# Patient Record
Sex: Female | Born: 1985 | Hispanic: No | Marital: Single | Smoking: Never smoker
Health system: Southern US, Community
[De-identification: ages and names within clinical notes are randomized; demographics above are authoritative.]

## PROBLEM LIST (undated history)

## (undated) DIAGNOSIS — I10 Essential (primary) hypertension: Secondary | ICD-10-CM

---
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
## 2011-04-16 ENCOUNTER — Emergency Department (HOSPITAL_BASED_OUTPATIENT_CLINIC_OR_DEPARTMENT_OTHER)
Admission: EM | Admit: 2011-04-16 | Discharge: 2011-04-17 | Disposition: A | Discharge status: Home / Self Care | Payer: Medicaid Other | Attending: Emergency Medicine | Admitting: Emergency Medicine

## 2011-04-16 DIAGNOSIS — I10 Essential (primary) hypertension: Secondary | ICD-10-CM | POA: Insufficient documentation

## 2011-04-16 DIAGNOSIS — O2690 Pregnancy related conditions, unspecified, unspecified trimester: Secondary | ICD-10-CM | POA: Insufficient documentation

## 2011-04-16 DIAGNOSIS — R109 Unspecified abdominal pain: Secondary | ICD-10-CM | POA: Insufficient documentation

## 2011-04-16 HISTORY — DX: Essential (primary) hypertension: I10

## 2011-04-16 LAB — URINALYSIS, ROUTINE W REFLEX MICROSCOPIC
Bilirubin Urine: NEGATIVE
Glucose, UA: NEGATIVE mg/dL
Hgb urine dipstick: NEGATIVE
Ketones, ur: 40 mg/dL — AB
Leukocytes, UA: NEGATIVE
Nitrite: NEGATIVE
Protein, ur: NEGATIVE mg/dL
Specific Gravity, Urine: 1.028 (ref 1.005–1.030)
Urobilinogen, UA: 1 mg/dL (ref 0.0–1.0)
pH: 6 (ref 5.0–8.0)

## 2011-04-16 LAB — PREGNANCY, URINE: Preg Test, Ur: POSITIVE

## 2011-04-16 NOTE — ED Notes (Signed)
abd pain, vaginal d/c

## 2011-04-17 LAB — HCG, QUANTITATIVE, PREGNANCY: hCG, Beta Chain, Quant, S: 77796 m[IU]/mL — ABNORMAL HIGH (ref <5)

## 2011-04-17 MED ORDER — PRENATAL RX 60-1 MG PO TABS: 1.0000 | ORAL_TABLET | Freq: Every day | ORAL | Status: AC

## 2011-04-17 NOTE — ED Provider Notes (Signed)
History     CSN: [id number] Arrival date & time: 04/16/2011 11:59 PM   First MD Initiated Contact with Patient 04/17/11 0016      Chief Complaint  Patient presents with  . Abdominal Pain  . Vaginal Discharge    (Consider location/radiation/quality/duration/timing/severity/associated sxs/prior treatment) Patient is a 25 y.o. female presenting with cramps. The history is provided by the patient.  Abdominal Cramping The primary symptoms of the illness include abdominal pain. The primary symptoms of the illness do not include fever, shortness of breath or dysuria. The current episode started 2 days ago. The onset of the illness was gradual. The problem has not changed since onset. Associated with: LMP July 2012. The patient states that she believes she is currently pregnant. The patient has not had a change in bowel habit. Symptoms associated with the illness do not include chills, anorexia, diaphoresis, heartburn, constipation, urgency, hematuria, frequency or back pain. Associated medical issues comments: Has had some increased white vaginal discharge. No rash or lesions..   Is sexually active and until tonight was not aware that she is pregnant. She is G73 P80 53-year-old child. No vaginal bleeding. She's been having some fullness and intermittent cramping in her midline pelvic region. No back pain. No fevers. No nausea or vomiting. Symptoms mild. No radiation of pain. No aggravating or alleviating factors.  Past Medical History  Diagnosis Date  . Hypertension     History reviewed. No pertinent past surgical history.  No family history on file.  History  Substance Use Topics  . Smoking status: Never Smoker   . Smokeless tobacco: Not on file  . Alcohol Use: No    OB History    Grav Para Term Preterm Abortions TAB SAB Ect Mult Living                  Review of Systems  Constitutional: Negative for fever, chills and diaphoresis.  HENT: Negative for neck pain and neck  stiffness.   Eyes: Negative for pain.  Respiratory: Negative for shortness of breath.   Cardiovascular: Negative for chest pain.  Gastrointestinal: Positive for abdominal pain. Negative for heartburn, constipation and anorexia.  Genitourinary: Negative for dysuria, urgency, frequency and hematuria.  Musculoskeletal: Negative for back pain.  Skin: Negative for rash.  Neurological: Negative for headaches.  All other systems reviewed and are negative.    Allergies  Review of patient's allergies indicates no known allergies.  Home Medications  No current outpatient prescriptions on file.  BP 126/62  Pulse 76  Temp(Src) 98.5 F (36.9 C) (Oral)  Resp 16  Ht 5\' 7"  (1.702 m)  Wt 170 lb (77.111 kg)  BMI 26.63 kg/m2  SpO2 100%  LMP 12/21/2010  Physical Exam  Constitutional: She is oriented to person, place, and time. She appears well-developed and well-nourished.  HENT:  Head: Normocephalic and atraumatic.  Eyes: Conjunctivae and EOM are normal. Pupils are equal, round, and reactive to light.  Neck: Trachea normal. Neck supple. No thyromegaly present.  Cardiovascular: Normal rate, regular rhythm, S1 normal, S2 normal and normal pulses.     No systolic murmur is present   No diastolic murmur is present  Pulses:      Radial pulses are 2+ on the right side, and 2+ on the left side.  Pulmonary/Chest: Effort normal and breath sounds normal. She has no wheezes. She has no rhonchi. She has no rales. She exhibits no tenderness.  Abdominal: Soft. Normal appearance and bowel sounds are normal. There is no  tenderness. There is no CVA tenderness and negative Murphy's sign.       Gravid uterus. No reproducible tenderness  Genitourinary: Vagina normal and uterus normal.  Musculoskeletal:       BLE:s Calves nontender, no cords or erythema, negative Homans sign  Neurological: She is alert and oriented to person, place, and time. She has normal strength. No cranial nerve deficit or sensory  deficit. GCS eye subscore is 4. GCS verbal subscore is 5. GCS motor subscore is 6.  Skin: Skin is warm and dry. No rash noted. She is not diaphoretic.  Psychiatric: Her speech is normal.       Cooperative and appropriate    ED Course  [place] bedside Date/Time: 04/17/2011 2:41 AM Performed by: Sunnie Nielsen Authorized by: Sunnie Nielsen Consent: Verbal consent obtained. Risks and benefits: risks, benefits and alternatives were discussed Consent given by: patient Patient understanding: patient states understanding of the procedure being performed Patient consent: the patient's understanding of the procedure matches consent given Procedure consent: procedure consent matches procedure scheduled Patient identity confirmed: verbally with patient Time out: Immediately prior to procedure a "time out" was called to verify the correct patient, procedure, equipment, support staff and site/side marked as required. Comments: POS preg test with cramping. LMP 4 months ago. Has IUP with BPD [redacted]w[redacted]d and FHR 140 with fetal activity present.    (including critical care time)  Results for orders placed during the hospital encounter of 04/16/11  URINALYSIS, ROUTINE W REFLEX MICROSCOPIC      Component Value Range   Color, Urine YELLOW  YELLOW    Appearance CLEAR  CLEAR    Specific Gravity, Urine 1.028  1.005 - 1.030    pH 6.0  5.0 - 8.0    Glucose, UA NEGATIVE  NEGATIVE (mg/dL)   Hgb urine dipstick NEGATIVE  NEGATIVE    Bilirubin Urine NEGATIVE  NEGATIVE    Ketones, ur 40 (*) NEGATIVE (mg/dL)   Protein, ur NEGATIVE  NEGATIVE (mg/dL)   Urobilinogen, UA 1.0  0.0 - 1.0 (mg/dL)   Nitrite NEGATIVE  NEGATIVE    Leukocytes, UA NEGATIVE  NEGATIVE   PREGNANCY, URINE      Component Value Range   Preg Test, Ur POSITIVE    HCG, QUANTITATIVE, PREGNANCY      Component Value Range   hCG, Beta Chain, Quant, S 40981 (*) <5 (mIU/mL)    Initial blood pressure elevated on recheck is normal. No right upper quadrant  abdominal pain or tenderness and no protein in urine. No headaches or symptoms of preeclampsia otherwise in her first pregnancy was normal.   MDM   Urine pregnancy test positive at [facility] obtained and reviewed. Bedside ultrasound demonstrates approximately 18 week IUP with good fetal heart tones. OB/GYN referral provided. Reliable historian and agrees to discharge and followup instructions. She agrees to start prenatal vitamins immediately.       Sunnie Nielsen, MD 04/17/11 [phone]

## 2014-01-03 ENCOUNTER — Emergency Department (HOSPITAL_BASED_OUTPATIENT_CLINIC_OR_DEPARTMENT_OTHER): Payer: No Typology Code available for payment source

## 2014-01-03 ENCOUNTER — Emergency Department (HOSPITAL_BASED_OUTPATIENT_CLINIC_OR_DEPARTMENT_OTHER)
Admission: EM | Admit: 2014-01-03 | Discharge: 2014-01-03 | Disposition: A | Discharge status: Home / Self Care | Payer: No Typology Code available for payment source | Attending: Emergency Medicine | Admitting: Emergency Medicine

## 2014-01-03 ENCOUNTER — Encounter (HOSPITAL_BASED_OUTPATIENT_CLINIC_OR_DEPARTMENT_OTHER): Payer: Self-pay | Admitting: Emergency Medicine

## 2014-01-03 DIAGNOSIS — I10 Essential (primary) hypertension: Secondary | ICD-10-CM | POA: Diagnosis not present

## 2014-01-03 DIAGNOSIS — Z3202 Encounter for pregnancy test, result negative: Secondary | ICD-10-CM | POA: Diagnosis not present

## 2014-01-03 DIAGNOSIS — M62838 Other muscle spasm: Secondary | ICD-10-CM | POA: Insufficient documentation

## 2014-01-03 DIAGNOSIS — IMO0002 Reserved for concepts with insufficient information to code with codable children: Secondary | ICD-10-CM | POA: Insufficient documentation

## 2014-01-03 DIAGNOSIS — Y92410 Unspecified street and highway as the place of occurrence of the external cause: Secondary | ICD-10-CM | POA: Insufficient documentation

## 2014-01-03 DIAGNOSIS — Y9389 Activity, other specified: Secondary | ICD-10-CM | POA: Insufficient documentation

## 2014-01-03 LAB — PREGNANCY, URINE: Preg Test, Ur: NEGATIVE

## 2014-01-03 MED ORDER — IBUPROFEN 800 MG PO TABS: 800.0000 mg | ORAL_TABLET | Freq: Three times a day (TID) | ORAL | Status: DC

## 2014-01-03 MED ORDER — TRAMADOL HCL 50 MG PO TABS: 50.0000 mg | ORAL_TABLET | Freq: Four times a day (QID) | ORAL | Status: DC | PRN

## 2014-01-03 MED ORDER — NAPROXEN 250 MG PO TABS
500.0000 mg | ORAL_TABLET | Freq: Once | ORAL | Status: AC
  Administered 2014-01-03: 500 mg via ORAL
  Filled 2014-01-03: qty 2

## 2014-01-03 MED ORDER — METHOCARBAMOL 500 MG PO TABS: 500.0000 mg | ORAL_TABLET | Freq: Two times a day (BID) | ORAL | Status: DC

## 2014-01-03 MED ORDER — METHOCARBAMOL 500 MG PO TABS
ORAL_TABLET | ORAL | Status: AC
  Filled 2014-01-03: qty 2

## 2014-01-03 MED ORDER — METHOCARBAMOL 500 MG PO TABS
1000.0000 mg | ORAL_TABLET | Freq: Once | ORAL | Status: AC
  Administered 2014-01-03: 1000 mg via ORAL
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
## 2014-01-03 NOTE — ED Notes (Signed)
Patient transported to X-ray 

## 2014-01-03 NOTE — Discharge Instructions (Signed)
Back Pain, Adult °Back pain is very common. The pain often gets better over time. The cause of back pain is usually not dangerous. Most people can learn to manage their back pain on their own.  °HOME CARE  °· Stay active. Start with short walks on flat ground if you can. Try to walk farther each day. °· Do not sit, drive, or stand in one place for more than 30 minutes. Do not stay in bed. °· Do not avoid exercise or work. Activity can help your back heal faster. °· Be careful when you bend or lift an object. Bend at your knees, keep the object close to you, and do not twist. °· Sleep on a firm mattress. Lie on your side, and bend your knees. If you lie on your back, put a pillow under your knees. °· Only take medicines as told by your doctor. °· Put ice on the injured area. °¨ Put ice in a plastic bag. °¨ Place a towel between your skin and the bag. °¨ Leave the ice on for 15-20 minutes, 03-04 times a day for the first 2 to 3 days. After that, you can switch between ice and heat packs. °· Ask your doctor about back exercises or massage. °· Avoid feeling anxious or stressed. Find good ways to deal with stress, such as exercise. °GET HELP RIGHT AWAY IF:  °· Your pain does not go away with rest or medicine. °· Your pain does not go away in 1 week. °· You have new problems. °· You do not feel well. °· The pain spreads into your legs. °· You cannot control when you poop (bowel movement) or pee (urinate). °· Your arms or legs feel weak or lose feeling (numbness). °· You feel sick to your stomach (nauseous) or throw up (vomit). °· You have belly (abdominal) pain. °· You feel like you may pass out (faint). °MAKE SURE YOU:  °· Understand these instructions. °· Will watch your condition. °· Will get help right away if you are not doing well or get worse. °Document Released: 11/05/2007 Document Revised: 08/11/2011 Document Reviewed: 09/20/2013 °Exit[employer]® Patient Information ©2015 [employer]. This information is not intended  to replace advice given to you by your health care provider. Make sure you discuss any questions you have with your health care provider. ° °

## 2014-01-03 NOTE — ED Notes (Signed)
Back pain x 4days  No known injury
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
## 2015-10-22 ENCOUNTER — Encounter (HOSPITAL_BASED_OUTPATIENT_CLINIC_OR_DEPARTMENT_OTHER): Payer: Self-pay | Admitting: *Deleted

## 2015-10-22 DIAGNOSIS — D509 Iron deficiency anemia, unspecified: Secondary | ICD-10-CM | POA: Insufficient documentation

## 2015-10-22 DIAGNOSIS — I10 Essential (primary) hypertension: Secondary | ICD-10-CM | POA: Insufficient documentation

## 2015-10-22 DIAGNOSIS — R509 Fever, unspecified: Secondary | ICD-10-CM | POA: Insufficient documentation

## 2015-10-22 MED ORDER — IBUPROFEN 800 MG PO TABS
800.0000 mg | ORAL_TABLET | Freq: Once | ORAL | Status: AC
  Administered 2015-10-22: 800 mg via ORAL
  Filled 2015-10-22: qty 1

## 2015-10-22 NOTE — ED Notes (Signed)
Generalized body aches since last night.  Denies fever PTA.

## 2015-10-23 ENCOUNTER — Emergency Department (HOSPITAL_BASED_OUTPATIENT_CLINIC_OR_DEPARTMENT_OTHER)
Admission: EM | Admit: 2015-10-23 | Discharge: 2015-10-23 | Disposition: A | Discharge status: Home / Self Care | Payer: No Typology Code available for payment source | Attending: Emergency Medicine | Admitting: Emergency Medicine

## 2015-10-23 DIAGNOSIS — D509 Iron deficiency anemia, unspecified: Secondary | ICD-10-CM

## 2015-10-23 DIAGNOSIS — R509 Fever, unspecified: Secondary | ICD-10-CM

## 2015-10-23 LAB — CBC WITH DIFFERENTIAL/PLATELET
BASOS ABS: 0 10*3/uL (ref 0.0–0.1)
Basophils Relative: 0 %
Eosinophils Absolute: 0.1 10*3/uL (ref 0.0–0.7)
Eosinophils Relative: 1 %
HCT: 28.6 % — ABNORMAL LOW (ref 36.0–46.0)
Hemoglobin: 8.6 g/dL — ABNORMAL LOW (ref 12.0–15.0)
LYMPHS ABS: 1.5 10*3/uL (ref 0.7–4.0)
Lymphocytes Relative: 15 %
MCH: 22.5 pg — ABNORMAL LOW (ref 26.0–34.0)
MCHC: 30.1 g/dL (ref 30.0–36.0)
MCV: 74.9 fL — AB (ref 78.0–100.0)
MONOS PCT: 12 %
Monocytes Absolute: 1.2 10*3/uL — ABNORMAL HIGH (ref 0.1–1.0)
NEUTROS ABS: 7.2 10*3/uL (ref 1.7–7.7)
Neutrophils Relative %: 72 %
PLATELETS: 495 10*3/uL — AB (ref 150–400)
RBC: 3.82 MIL/uL — ABNORMAL LOW (ref 3.87–5.11)
RDW: 18.8 % — AB (ref 11.5–15.5)
WBC: 10 10*3/uL (ref 4.0–10.5)

## 2015-10-23 LAB — URINALYSIS, ROUTINE W REFLEX MICROSCOPIC
BILIRUBIN URINE: NEGATIVE
Glucose, UA: NEGATIVE mg/dL
Ketones, ur: NEGATIVE mg/dL
Leukocytes, UA: NEGATIVE
NITRITE: POSITIVE — AB
Protein, ur: NEGATIVE mg/dL
SPECIFIC GRAVITY, URINE: 1.009 (ref 1.005–1.030)
pH: 6.5 (ref 5.0–8.0)

## 2015-10-23 LAB — URINE MICROSCOPIC-ADD ON

## 2015-10-23 LAB — PREGNANCY, URINE: Preg Test, Ur: NEGATIVE

## 2015-10-23 LAB — RAPID STREP SCREEN (MED CTR MEBANE ONLY): STREPTOCOCCUS, GROUP A SCREEN (DIRECT): NEGATIVE

## 2015-10-23 MED ORDER — IBUPROFEN 800 MG PO TABS: ORAL_TABLET | ORAL | Status: AC

## 2015-10-23 MED ORDER — ONDANSETRON 8 MG PO TBDP
8.0000 mg | ORAL_TABLET | Freq: Once | ORAL | Status: AC
  Administered 2015-10-23: 8 mg via ORAL
  Filled 2015-10-23: qty 1

## 2015-10-23 MED ORDER — ONDANSETRON 8 MG PO TBDP: 8.0000 mg | ORAL_TABLET | Freq: Three times a day (TID) | ORAL | Status: AC | PRN

## 2015-10-23 NOTE — ED Provider Notes (Addendum)
CSN: [id number]     Arrival date & time 10/22/15  2115 History   First MD Initiated Contact with Patient 10/23/15 [phone]     Chief Complaint  Patient presents with  . Generalized Body Aches     (Consider location/radiation/quality/duration/timing/severity/associated sxs/prior Treatment) HPI  This is a 30 year old female who developed generalized body aches, chills and nausea yesterday. The onset was gradual. She has not been able to eat or drink due to the nausea. She took one gram of Tylenol at home without relief. She was noted to be febrile at 101.9 on arrival and was given 800 milligrams of ibuprofen with defervescence and partial improvement in her body aches. She denies nasal congestion, sore throat, cough, shortness of breath, vomiting, diarrhea, abdominal pain and dysuria.  Past Medical History  Diagnosis Date  . Hypertension    History reviewed. No pertinent past surgical history. History reviewed. No pertinent family history. Social History  Substance Use Topics  . Smoking status: Never Smoker   . Smokeless tobacco: None  . Alcohol Use: No   OB History    No data available     Review of Systems  All other systems reviewed and are negative.   Allergies  Review of patient's allergies indicates no known allergies.  Home Medications   Prior to Admission medications   Not on File   BP 134/83 mmHg  Pulse 86  Temp(Src) 98 F (36.7 C) (Oral)  Resp 22  Ht 5\' 7"  (1.702 m)  Wt 165 lb (74.844 kg)  BMI 25.84 kg/m2  SpO2 100%  LMP 10/08/2015   Physical Exam  General: Well-developed, well-nourished female in no acute distress; appearance consistent with age of record HENT: normocephalic; atraumatic; no pharyngeal erythema or exudate Eyes: pupils equal, round and reactive to light; extraocular muscles intact Neck: supple Heart: regular rate and rhythm Lungs: clear to auscultation bilaterally Abdomen: soft; nondistended; nontender; no masses or hepatosplenomegaly;  bowel sounds present Extremities: No deformity; full range of motion; pulses normal Neurologic: Awake, alert and oriented; motor function intact in all extremities and symmetric; no facial droop Skin: Warm and dry Psychiatric: Normal mood and affect     ED Course  Procedures (including critical care time)   MDM   Nursing notes and vitals signs, including pulse oximetry, reviewed.  Summary of this visit's results, reviewed by myself:  Labs:  Results for orders placed or performed during the hospital encounter of 10/23/15 (from the past 24 hour(s))  Pregnancy, urine     Status: None   Collection Time: 10/23/15  1:50 AM  Result Value Ref Range   Preg Test, Ur NEGATIVE NEGATIVE  Urinalysis, Routine w reflex microscopic ([facility])     Status: Abnormal   Collection Time: 10/23/15  1:50 AM  Result Value Ref Range   Color, Urine YELLOW YELLOW   APPearance CLOUDY (A) CLEAR   Specific Gravity, Urine 1.009 1.005 - 1.030   pH 6.5 5.0 - 8.0   Glucose, UA NEGATIVE NEGATIVE mg/dL   Hgb urine dipstick MODERATE (A) NEGATIVE   Bilirubin Urine NEGATIVE NEGATIVE   Ketones, ur NEGATIVE NEGATIVE mg/dL   Protein, ur NEGATIVE NEGATIVE mg/dL   Nitrite POSITIVE (A) NEGATIVE   Leukocytes, UA NEGATIVE NEGATIVE  Urine microscopic-add on     Status: Abnormal   Collection Time: 10/23/15  1:50 AM  Result Value Ref Range   Squamous Epithelial / LPF 6-30 (A) NONE SEEN   WBC, UA 0-5 0 - 5 WBC/hpf   RBC /  HPF 6-30 0 - 5 RBC/hpf   Bacteria, UA MANY (A) NONE SEEN  Rapid strep screen     Status: None   Collection Time: 10/23/15  2:30 AM  Result Value Ref Range   Streptococcus, Group A Screen (Direct) NEGATIVE NEGATIVE  CBC with Differential/Platelet     Status: Abnormal   Collection Time: 10/23/15  2:40 AM  Result Value Ref Range   WBC 10.0 4.0 - 10.5 K/uL   RBC 3.82 (L) 3.87 - 5.11 MIL/uL   Hemoglobin 8.6 (L) 12.0 - 15.0 g/dL   HCT 16.1 (L) 09.6 - 04.5 %   MCV 74.9 (L) 78.0 - 100.0 fL   MCH  22.5 (L) 26.0 - 34.0 pg   MCHC 30.1 30.0 - 36.0 g/dL   RDW 40.9 (H) 81.1 - 91.4 %   Platelets 495 (H) 150 - 400 K/uL   Neutrophils Relative % 72 %   Lymphocytes Relative 15 %   Monocytes Relative 12 %   Eosinophils Relative 1 %   Basophils Relative 0 %   Neutro Abs 7.2 1.7 - 7.7 K/uL   Lymphs Abs 1.5 0.7 - 4.0 K/uL   Monocytes Absolute 1.2 (H) 0.1 - 1.0 K/uL   Eosinophils Absolute 0.1 0.0 - 0.7 K/uL   Basophils Absolute 0.0 0.0 - 0.1 K/uL   3:40 AM Patient resting comfortably. States she still feels bad. Symptoms are consistent with a viral illness. She was advised to return for worsening symptoms. She was advised of her anemia and need for follow-up and to establish with a PCP. She was given the number for physician referral service.  Paula Libra, MD 10/23/15 [id number]  Paula Libra, MD 10/23/15 [phone]

## 2015-10-23 NOTE — ED Notes (Signed)
Patient reports that she is  Having generalized pain to her lower legs, with a HA. Reports that she is unable to eat or drink anything r/t to the nausea. The patient reports generalized weakness and feeling general malaise.

## 2015-10-25 LAB — URINE CULTURE: Culture: >=100000 — AB

## 2015-10-25 LAB — CULTURE, GROUP A STREP (THRC)

## 2015-10-26 ENCOUNTER — Telehealth: Payer: Self-pay | Admitting: *Deleted

## 2015-10-26 NOTE — ED Notes (Signed)
Post ED Visit - Positive Culture Follow-up  Culture report reviewed by antimicrobial stewardship pharmacist:  []  Enzo BiNathan Batchelder, Pharm.D. []  Celedonio MiyamotoJeremy Frens, Pharm.D., BCPS []  Garvin FilaMike Maccia, Pharm.D. []  Georgina PillionElizabeth Martin, Pharm.D., BCPS []  VegaMinh Pham, [address]Pharm.D., BCPS, AAHIVP []  Estella HuskMichelle Turner, Pharm.D., BCPS, AAHIVP []  Cassie Stewart, Pharm.D. []  Sherle Poeob Vincent, [place].  Positive urine culture, contaminated No further patient follow-up is required at this time per Elpidio AnisShari Upstill, PA-C  Lysle PearlRobertson, Tavie Haseman Talley 10/26/2015, 12:03 PM

## 2015-10-26 NOTE — Progress Notes (Signed)
ED Antimicrobial Stewardship Positive Culture Follow Up   Krystal Andrews is an 30 y.o. female who [facility] with a chief complaint of  Chief Complaint  Patient presents with  . Generalized Body Aches    Recent Results (from the past 720 hour(s))  Urine culture     Status: Abnormal   Collection Time: 10/23/15  1:50 AM  Result Value Ref Range Status   Specimen Description URINE, CLEAN CATCH  Final   Special Requests NONE  Final   Culture >=100,000 COLONIES/mL ESCHERICHIA COLI (A)  Final   Report Status 10/25/2015 FINAL  Final   Organism ID, Bacteria ESCHERICHIA COLI (A)  Final      Susceptibility   Escherichia coli - MIC*    AMPICILLIN >=32 RESISTANT Resistant     CEFAZOLIN <=4 SENSITIVE Sensitive     CEFTRIAXONE <=1 SENSITIVE Sensitive     CIPROFLOXACIN <=0.25 SENSITIVE Sensitive     GENTAMICIN <=1 SENSITIVE Sensitive     IMIPENEM <=0.25 SENSITIVE Sensitive     NITROFURANTOIN <=16 SENSITIVE Sensitive     TRIMETH/SULFA <=20 SENSITIVE Sensitive     AMPICILLIN/SULBACTAM 16 INTERMEDIATE Intermediate     PIP/TAZO <=4 SENSITIVE Sensitive     * >=100,000 COLONIES/mL ESCHERICHIA COLI  Rapid strep screen     Status: None   Collection Time: 10/23/15  2:30 AM  Result Value Ref Range Status   Streptococcus, Group A Screen (Direct) NEGATIVE NEGATIVE Final    Comment: (NOTE) A Rapid Antigen test may result negative if the antigen level in the sample is below the detection level of this test. The FDA has not cleared this test as a stand-alone test therefore the rapid antigen negative result has reflexed to a Group A Strep culture.   Culture, group A strep     Status: None   Collection Time: 10/23/15  2:30 AM  Result Value Ref Range Status   Specimen Description THROAT  Final   Special Requests NONE Reflexed from [id number]  Final   Culture   Final    NO GROUP A STREP (S.PYOGENES) ISOLATED [facility]    Report Status 10/25/2015 FINAL   Final    No urinary symptoms. Not pregnant. No treatment indicated.  ED Provider: Elpidio AnisShari Upstill, PA-C  Cassie L. Roseanne RenoStewart, PharmD PGY2 Infectious Diseases Pharmacy Resident Pager: [phone] 10/26/2015 10:05 AM

## 2020-02-28 ENCOUNTER — Encounter (HOSPITAL_BASED_OUTPATIENT_CLINIC_OR_DEPARTMENT_OTHER): Payer: Self-pay | Admitting: *Deleted

## 2020-02-28 ENCOUNTER — Other Ambulatory Visit: Payer: Self-pay

## 2020-02-28 ENCOUNTER — Emergency Department (HOSPITAL_BASED_OUTPATIENT_CLINIC_OR_DEPARTMENT_OTHER)
Admission: EM | Admit: 2020-02-28 | Discharge: 2020-02-28 | Disposition: A | Discharge status: Home / Self Care | Payer: Self-pay | Attending: Emergency Medicine | Admitting: Emergency Medicine

## 2020-02-28 DIAGNOSIS — R21 Rash and other nonspecific skin eruption: Secondary | ICD-10-CM | POA: Insufficient documentation

## 2020-02-28 DIAGNOSIS — I10 Essential (primary) hypertension: Secondary | ICD-10-CM | POA: Insufficient documentation

## 2020-02-28 MED ORDER — HYDROCORTISONE 1 % EX CREA: TOPICAL_CREAM | CUTANEOUS | 0 refills | Status: AC

## 2020-02-28 MED ORDER — PREDNISONE 20 MG PO TABS: 20.0000 mg | ORAL_TABLET | Freq: Every day | ORAL | 0 refills | Status: AC

## 2020-02-28 MED ORDER — MUPIROCIN CALCIUM 2 % EX CREA
1.0000 | TOPICAL_CREAM | Freq: Two times a day (BID) | CUTANEOUS | 0 refills | Status: AC
Start: 2020-02-28 — End: ?

## 2020-02-28 MED ORDER — HYDROXYZINE HCL 25 MG PO TABS: 25.0000 mg | ORAL_TABLET | Freq: Four times a day (QID) | ORAL | 0 refills | Status: AC

## 2020-02-28 NOTE — ED Triage Notes (Signed)
C/o rash to bil arms x 2 days , blisters noted

## 2020-02-28 NOTE — Discharge Instructions (Signed)
Take the medications as prescribed  Return for new or worsening symptoms 

## 2020-02-28 NOTE — ED Provider Notes (Signed)
[facility] EMERGENCY DEPARTMENT Provider Note   CSN: [id number] Arrival date & time: 02/28/20  1820     History Chief Complaint  Patient presents with  . Rash    Krystal Andrews is a 34 y.o. female with past medical history significant for hypertension who presents for evaluation of rash. Symptom onset 2 days ago. 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 Rashes pruritic in nature. No shortness of breath, sensation of throat closing, cough. Rashes located to bilateral upper extremities. Has not take anything for symptoms. Denies aggravating or alleviating factors. No intraoral, mucosa lesions. No lesions to plantar aspect of hands and feet. Denies chance of pregnancy.  History obtained from patient and past medical records. No interpreter used.  HPI     Past Medical History:  Diagnosis Date  . Hypertension     There are no problems to display for this patient.   History reviewed. No pertinent surgical history.   OB History   No obstetric history on file.     No family history on file.  Social History   Tobacco Use  . Smoking status: Never Smoker  Substance Use Topics  . Alcohol use: No  . Drug use: No    Home Medications Prior to Admission medications   Medication Sig Start Date End Date Taking? Authorizing Provider  hydrocortisone cream 1 % Apply to affected area 2 times daily 02/28/20   Will Heinkel A, PA-C  hydrOXYzine (ATARAX/VISTARIL) 25 MG tablet Take 1 tablet (25 mg total) by mouth every 6 (six) hours. 02/28/20   Prather Failla A, PA-C  ibuprofen (ADVIL,MOTRIN) 800 MG tablet Take one tablet 3 times daily with food as needed for fever or body aches. 10/23/15   Molpus, John, MD  mupirocin cream (BACTROBAN) 2 % Apply 1 application topically 2 (two) times daily. 02/28/20   Muneer Leider A, PA-C  ondansetron (ZOFRAN ODT) 8 MG disintegrating tablet Take 1 tablet (8 mg total) by mouth every 8  (eight) hours as needed for nausea or vomiting. 10/23/15   Molpus, John, MD  predniSONE (DELTASONE) 20 MG tablet Take 1 tablet (20 mg total) by mouth daily for 5 days. 02/28/20 03/04/20  Jonanthan Bolender A, PA-C    Allergies    Patient has no known allergies.  Review of Systems   Review of Systems  Constitutional: Negative.   HENT: Negative.   Respiratory: Negative.   Cardiovascular: Negative.   Genitourinary: Negative.   Musculoskeletal: Negative.   Skin: Positive for rash.  Neurological: Negative.   All other systems reviewed and are negative.   Physical Exam Updated Vital Signs BP (!) 147/99   Pulse 78   Temp 98.6 F (37 C) (Oral)   Resp 18   Ht 5\' 8"  (1.727 m)   Wt 72.6 kg   LMP 01/30/2020   SpO2 100%   BMI 24.33 kg/m   Physical Exam Vitals and nursing note reviewed.  Constitutional:      General: She is not in acute distress.    Appearance: She is well-developed. She is not ill-appearing, toxic-appearing or diaphoretic.  HENT:     Head: Normocephalic and atraumatic.     Nose: Nose normal.     Mouth/Throat:     Mouth: Mucous membranes are moist.  Eyes:     Pupils: Pupils are equal, round, and reactive to light.  Cardiovascular:     Rate and Rhythm: Normal rate.     Pulses: Normal  pulses.     Heart sounds: Normal heart sounds.  Pulmonary:     Effort: Pulmonary effort is normal. No respiratory distress.     Breath sounds: Normal breath sounds.  Abdominal:     General: Bowel sounds are normal. There is no distension.     Tenderness: There is no abdominal tenderness. There is no right CVA tenderness, left CVA tenderness, guarding or rebound.  Musculoskeletal:        General: No swelling, tenderness, deformity or signs of injury. Normal range of motion.     Cervical back: Normal range of motion.     Right lower leg: No edema.     Left lower leg: No edema.  Skin:    General: Skin is warm and dry.     Capillary Refill: Capillary refill takes less than 2  seconds.     Findings: Rash present.     Comments: Patient with erythematous pustular rash to bilateral upper and lower extremities. There are also areas of urticaria however no fluctuance or induration. No bulla, target lesions, desquamated skin. No evidence of intraoral lesions.  Neurological:     General: No focal deficit present.     Mental Status: She is alert and oriented to person, place, and time.     ED Results / Procedures / Treatments   Labs (all labs ordered are listed, but only abnormal results are displayed) Labs Reviewed - No data to display  EKG None  Radiology No results found.  Procedures Procedures (including critical care time)  Medications Ordered in ED Medications - No data to display  ED Course  I have reviewed the triage vital signs and the nursing notes.  Pertinent labs & imaging results that were available during my care of the patient were reviewed by me and considered in my medical decision making (see chart for details).  34 year old female presents for evaluation of rash which began 2 days ago. She is afebrile, nonseptic, not ill-appearing. Patient with erythematous pustular rash to bilateral upper extremities. Not in a dermatomal distribution. There is also urticarial component. No evidence of intraoral lesions. No sensation of throat closing, chest pain, shortness of breath or lightheadedness. Heart and lungs clear. Abdomen soft, nontender.  Rash consistent with contact dermatitis and urticarial component. Patient denies any difficulty breathing or swallowing.  Pt has a patent airway without stridor and is handling secretions without difficulty; no angioedema. No blisters, no warmth, no draining sinus tracts, no superficial abscesses, no bullous impetigo, no vesicles, no desquamation, no target lesions with dusky purpura or a central bulla. Not tender to touch. No concern for superimposed infection. No concern for SJS, TEN, TSS, tick borne illness,  syphilis or other life-threatening condition. Will discharge home with short course of steroids, pepcid and recommend Benadryl as needed for pruritis.  The patient has been appropriately medically screened and/or stabilized in the ED. I have low suspicion for any other emergent medical condition which would require further screening, evaluation or treatment in the ED or require inpatient management.  Patient is hemodynamically stable and in no acute distress.  Patient able to ambulate in department prior to ED.  Evaluation does not show acute pathology that would require ongoing or additional emergent interventions while in the emergency department or further inpatient treatment.  I have discussed the diagnosis with the patient and answered all questions.  Pain is been managed while in the emergency department and patient has no further complaints prior to discharge.  Patient is comfortable with  plan discussed in room and is stable for discharge at this time.  I have discussed strict return precautions for returning to the emergency department.  Patient was encouraged to follow-up with PCP/specialist refer to at discharge.    MDM Rules/Calculators/A&P                           Final Clinical Impression(s) / ED Diagnoses Final diagnoses:  Rash    Rx / DC Orders ED Discharge Orders         Ordered    predniSONE (DELTASONE) 20 MG tablet  Daily        02/28/20 1906    hydrocortisone cream 1 %        02/28/20 1906    mupirocin cream (BACTROBAN) 2 %  2 times daily        02/28/20 1906    hydrOXYzine (ATARAX/VISTARIL) 25 MG tablet  Every 6 hours        02/28/20 1906           Rhegan Trunnell A, PA-C 02/28/20 1907    Terrilee Files, MD 02/29/20 1112
